# Patient Record
Sex: Male | Born: 1980 | Race: White | Hispanic: No | Marital: Single
Health system: Southern US, Community
[De-identification: ages and names within clinical notes are randomized; demographics above are authoritative.]

## PROBLEM LIST (undated history)

## (undated) DIAGNOSIS — Z8371 Family history of colonic polyps: Secondary | ICD-10-CM

## (undated) DIAGNOSIS — Z800 Family history of malignant neoplasm of digestive organs: Secondary | ICD-10-CM

## (undated) DIAGNOSIS — F909 Attention-deficit hyperactivity disorder, unspecified type: Secondary | ICD-10-CM

## (undated) DIAGNOSIS — F319 Bipolar disorder, unspecified: Secondary | ICD-10-CM

## (undated) HISTORY — DX: Family history of malignant neoplasm of digestive organs: Z80.0

## (undated) HISTORY — DX: Bipolar disorder, unspecified: F31.9

## (undated) HISTORY — DX: Attention-deficit hyperactivity disorder, unspecified type: F90.9

## (undated) HISTORY — DX: Family history of colonic polyps: Z83.71

---

## 2018-10-26 ENCOUNTER — Telehealth: Payer: Self-pay

## 2018-10-26 ENCOUNTER — Ambulatory Visit: Payer: Self-pay | Admitting: Psychiatry

## 2018-10-26 ENCOUNTER — Other Ambulatory Visit: Payer: Self-pay

## 2018-10-26 ENCOUNTER — Encounter: Payer: Self-pay | Admitting: Psychiatry

## 2018-10-26 VITALS — BP 119/78 | HR 58 | Temp 97.8°F | Wt 186.2 lb

## 2018-10-26 DIAGNOSIS — F17200 Nicotine dependence, unspecified, uncomplicated: Secondary | ICD-10-CM

## 2018-10-26 DIAGNOSIS — Z8659 Personal history of other mental and behavioral disorders: Secondary | ICD-10-CM

## 2018-10-26 DIAGNOSIS — F3160 Bipolar disorder, current episode mixed, unspecified: Secondary | ICD-10-CM

## 2018-10-26 DIAGNOSIS — F1210 Cannabis abuse, uncomplicated: Secondary | ICD-10-CM

## 2018-10-26 MED ORDER — RISPERIDONE 0.5 MG PO TABS: 0.5000 mg | ORAL_TABLET | Freq: Every day | ORAL | 1 refills | Status: DC

## 2018-10-26 MED ORDER — HYDROXYZINE PAMOATE 25 MG PO CAPS: 25.0000 mg | ORAL_CAPSULE | Freq: Every day | ORAL | 1 refills | Status: DC | PRN

## 2018-10-26 MED ORDER — DIVALPROEX SODIUM 250 MG PO DR TAB: 250.0000 mg | DELAYED_RELEASE_TABLET | Freq: Two times a day (BID) | ORAL | 1 refills | Status: DC

## 2018-10-26 MED ORDER — RISPERIDONE 0.5 MG PO TABS: 0.5000 mg | ORAL_TABLET | Freq: Every day | ORAL | 1 refills | Status: AC

## 2018-10-26 MED ORDER — DIVALPROEX SODIUM 250 MG PO DR TAB: 250.0000 mg | DELAYED_RELEASE_TABLET | Freq: Two times a day (BID) | ORAL | 1 refills | Status: AC

## 2018-10-26 MED ORDER — HYDROXYZINE PAMOATE 25 MG PO CAPS: 25.0000 mg | ORAL_CAPSULE | Freq: Every day | ORAL | 1 refills | Status: AC | PRN
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
## 2018-10-26 NOTE — Telephone Encounter (Signed)
pt called and left message on lea phone that he needs his rxs sent to [facility] on [address]

## 2018-10-26 NOTE — Telephone Encounter (Signed)
I gave him print out scripts ( hard scripts) ,please ask him to take it there if that is what he wants instead of medication management clinic.  thanks

## 2018-11-16 ENCOUNTER — Ambulatory Visit: Payer: Self-pay | Admitting: Psychiatry

## 2018-11-30 ENCOUNTER — Ambulatory Visit: Payer: Self-pay | Admitting: Psychiatry

## 2019-12-29 ENCOUNTER — Emergency Department (HOSPITAL_COMMUNITY): Payer: 59

## 2019-12-29 ENCOUNTER — Emergency Department (HOSPITAL_COMMUNITY)
Admission: EM | Admit: 2019-12-29 | Discharge: 2019-12-29 | Disposition: A | Discharge status: Home / Self Care | Payer: 59 | Source: Home / Self Care | Attending: Emergency Medicine | Admitting: Emergency Medicine

## 2019-12-29 ENCOUNTER — Other Ambulatory Visit: Payer: Self-pay

## 2019-12-29 ENCOUNTER — Emergency Department (HOSPITAL_COMMUNITY)
Admission: EM | Admit: 2019-12-29 | Discharge: 2019-12-29 | Disposition: A | Discharge status: Home / Self Care | Payer: 59 | Attending: Emergency Medicine | Admitting: Emergency Medicine

## 2019-12-29 ENCOUNTER — Encounter (HOSPITAL_COMMUNITY): Payer: Self-pay | Admitting: Emergency Medicine

## 2019-12-29 DIAGNOSIS — Y999 Unspecified external cause status: Secondary | ICD-10-CM | POA: Diagnosis not present

## 2019-12-29 DIAGNOSIS — Y92410 Unspecified street and highway as the place of occurrence of the external cause: Secondary | ICD-10-CM | POA: Diagnosis not present

## 2019-12-29 DIAGNOSIS — R451 Restlessness and agitation: Secondary | ICD-10-CM | POA: Diagnosis not present

## 2019-12-29 DIAGNOSIS — Y9389 Activity, other specified: Secondary | ICD-10-CM | POA: Insufficient documentation

## 2019-12-29 DIAGNOSIS — S0093XD Contusion of unspecified part of head, subsequent encounter: Secondary | ICD-10-CM | POA: Insufficient documentation

## 2019-12-29 DIAGNOSIS — R109 Unspecified abdominal pain: Secondary | ICD-10-CM | POA: Insufficient documentation

## 2019-12-29 DIAGNOSIS — R911 Solitary pulmonary nodule: Secondary | ICD-10-CM | POA: Insufficient documentation

## 2019-12-29 DIAGNOSIS — T1490XA Injury, unspecified, initial encounter: Secondary | ICD-10-CM

## 2019-12-29 DIAGNOSIS — Z5320 Procedure and treatment not carried out because of patient's decision for unspecified reasons: Secondary | ICD-10-CM | POA: Insufficient documentation

## 2019-12-29 DIAGNOSIS — S0093XA Contusion of unspecified part of head, initial encounter: Secondary | ICD-10-CM

## 2019-12-29 DIAGNOSIS — M542 Cervicalgia: Secondary | ICD-10-CM | POA: Insufficient documentation

## 2019-12-29 DIAGNOSIS — S0001XD Abrasion of scalp, subsequent encounter: Secondary | ICD-10-CM | POA: Insufficient documentation

## 2019-12-29 DIAGNOSIS — R410 Disorientation, unspecified: Secondary | ICD-10-CM | POA: Insufficient documentation

## 2019-12-29 DIAGNOSIS — R079 Chest pain, unspecified: Secondary | ICD-10-CM | POA: Insufficient documentation

## 2019-12-29 LAB — I-STAT CHEM 8, ED
BUN: 15 mg/dL (ref 6–20)
BUN: 10 mg/dL (ref 6–20)
Calcium, Ion: 1.09 mmol/L — ABNORMAL LOW (ref 1.15–1.40)
Calcium, Ion: 1.1 mmol/L — ABNORMAL LOW (ref 1.15–1.40)
Chloride: 107 mmol/L (ref 98–111)
Chloride: 109 mmol/L (ref 98–111)
Creatinine, Ser: 0.9 mg/dL (ref 0.61–1.24)
Creatinine, Ser: 0.8 mg/dL (ref 0.61–1.24)
Glucose, Bld: 85 mg/dL (ref 70–99)
Glucose, Bld: 89 mg/dL (ref 70–99)
HCT: 41 % (ref 39.0–52.0)
HCT: 46 % (ref 39.0–52.0)
Hemoglobin: 13.9 g/dL (ref 13.0–17.0)
Hemoglobin: 15.6 g/dL (ref 13.0–17.0)
Potassium: 3.7 mmol/L (ref 3.5–5.1)
Potassium: 4.5 mmol/L (ref 3.5–5.1)
Sodium: 141 mmol/L (ref 135–145)
Sodium: 141 mmol/L (ref 135–145)
TCO2: 25 mmol/L (ref 22–32)
TCO2: 24 mmol/L (ref 22–32)

## 2019-12-29 LAB — COMPREHENSIVE METABOLIC PANEL
ALT: 16 U/L (ref 0–44)
AST: 22 U/L (ref 15–41)
Albumin: 3.7 g/dL (ref 3.5–5.0)
Alkaline Phosphatase: 78 U/L (ref 38–126)
Anion gap: 8 (ref 5–15)
BUN: 13 mg/dL (ref 6–20)
CO2: 20 mmol/L — ABNORMAL LOW (ref 22–32)
Calcium: 8.1 mg/dL — ABNORMAL LOW (ref 8.9–10.3)
Chloride: 110 mmol/L (ref 98–111)
Creatinine, Ser: 0.82 mg/dL (ref 0.61–1.24)
GFR calc Af Amer: >60 mL/min (ref >60)
GFR calc non Af Amer: >60 mL/min (ref >60)
Glucose, Bld: 92 mg/dL (ref 70–99)
Potassium: 3.6 mmol/L (ref 3.5–5.1)
Sodium: 138 mmol/L (ref 135–145)
Total Bilirubin: 0.6 mg/dL (ref 0.3–1.2)
Total Protein: 5.9 g/dL — ABNORMAL LOW (ref 6.5–8.1)

## 2019-12-29 LAB — TYPE AND SCREEN
ABO/RH(D): A NEG
Antibody Screen: NEGATIVE

## 2019-12-29 LAB — CBC
HCT: 42.6 % (ref 39.0–52.0)
Hemoglobin: 14.4 g/dL (ref 13.0–17.0)
MCH: 30.7 pg (ref 26.0–34.0)
MCHC: 33.8 g/dL (ref 30.0–36.0)
MCV: 90.8 fL (ref 80.0–100.0)
Platelets: 179 10*3/uL (ref 150–400)
RBC: 4.69 MIL/uL (ref 4.22–5.81)
RDW: 13.1 % (ref 11.5–15.5)
WBC: 7.1 10*3/uL (ref 4.0–10.5)
nRBC: 0 % (ref 0.0–0.2)

## 2019-12-29 LAB — TROPONIN I (HIGH SENSITIVITY): Troponin I (High Sensitivity): <2 ng/L (ref <18)

## 2019-12-29 LAB — PROTIME-INR
INR: 1 (ref 0.8–1.2)
Prothrombin Time: 12.9 seconds (ref 11.4–15.2)

## 2019-12-29 LAB — CDS SEROLOGY

## 2019-12-29 LAB — ABO/RH: ABO/RH(D): A NEG

## 2019-12-29 LAB — ETHANOL: Alcohol, Ethyl (B): 173 mg/dL — ABNORMAL HIGH (ref <10)

## 2019-12-29 LAB — LACTIC ACID, PLASMA: Lactic Acid, Venous: 1.7 mmol/L (ref 0.5–1.9)

## 2019-12-29 MED ORDER — MIDAZOLAM HCL 2 MG/2ML IJ SOLN
4.0000 mg | Freq: Once | INTRAMUSCULAR | Status: DC
  Filled 2019-12-29: qty 4

## 2019-12-29 MED ORDER — CYCLOBENZAPRINE HCL 10 MG PO TABS: 10.0000 mg | ORAL_TABLET | Freq: Two times a day (BID) | ORAL | 0 refills | Status: AC | PRN

## 2019-12-29 MED ORDER — FENTANYL CITRATE (PF) 100 MCG/2ML IJ SOLN
50.0000 ug | Freq: Once | INTRAMUSCULAR | Status: AC
  Administered 2019-12-29: 03:00:00 50 ug via INTRAVENOUS

## 2019-12-29 MED ORDER — NAPROXEN 500 MG PO TABS: 500.0000 mg | ORAL_TABLET | Freq: Two times a day (BID) | ORAL | 0 refills | Status: AC

## 2019-12-29 MED ORDER — IOHEXOL 300 MG/ML  SOLN
100.0000 mL | Freq: Once | INTRAMUSCULAR | Status: AC | PRN
  Administered 2019-12-29: 100 mL via INTRAVENOUS

## 2019-12-29 NOTE — ED Triage Notes (Signed)
Pt left AMA then checked back in shortly after w/ complaints of dizziness, blurred vision and "just not feeling right."  Slightly agitated but able to be redirected. Provider instructed RN to re-order CTs that were ordered prior.
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
## 2019-12-29 NOTE — Progress Notes (Signed)
Orthopedic Tech Progress Note Patient Details:  Gregory Watkins 12/28/1874 [id number] Trauma Level 2 Patient ID: Gregory Watkins, male   DOB: 12/28/1874, 39 y.o.   MRN: [id number]   Ancil Linsey 12/29/2019, 2:29 AM

## 2019-12-29 NOTE — ED Notes (Signed)
Pt arguing on the phone with someone trying to get them to pick him up so he can leave. Pt visibly upset as he walked out the front doors.
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
## 2019-12-29 NOTE — Discharge Instructions (Signed)
Take medications as needed for pain.  Expect to be stiff and sore for the next few days.  Apply ice to try and help with any swelling and bruising.  The CT scans did not show any signs of serious injuries.  There were a couple of incidental radiology findings.  I have copied these findings below.  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 Consider follow-up imaging in 6-12 months with repeat CT of the chest to ensure stability. 3. Right upper lobe lung nodule measures 3 mm. No follow-up needed if patient is low-risk. Non-contrast chest CT can be considered in 12 months if patient is high-risk. This recommendation follows the consensus statement: Guidelines for Management of Incidental Pulmonary Nodules Detected on CT Images: From the Fleischner Society 2017; Radiology 2017; [phone]
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
## 2019-12-29 NOTE — ED Notes (Signed)
Cab voucher obtained by social work and given to patient after multiple attempts at contacting family and friends.

## 2019-12-29 NOTE — ED Notes (Signed)
Pt transported to CT ?
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
## 2019-12-29 NOTE — Care Management (Signed)
ED CM provided a taxi voucher. Patient does not have transportation to home.

## 2020-01-01 ENCOUNTER — Encounter: Payer: Self-pay | Admitting: Psychiatry

## 2020-01-23 ENCOUNTER — Ambulatory Visit: Payer: Self-pay | Admitting: Adult Health

## 2020-05-10 ENCOUNTER — Telehealth: Payer: Self-pay | Admitting: Genetic Counselor

## 2020-05-10 NOTE — Telephone Encounter (Signed)
Received a genetic counseling referral from the [place] [facility] for fhx of colon polyps. Mr. Gregory Watkins returned my call and has been scheduled to see Irving Burton on 5/25 at 1pm. Pt aware to  Arrive 15 minutes early.

## 2020-05-21 ENCOUNTER — Inpatient Hospital Stay: Payer: 59

## 2020-05-21 ENCOUNTER — Inpatient Hospital Stay: Payer: 59 | Attending: Genetic Counselor | Admitting: Genetic Counselor

## 2020-05-21 ENCOUNTER — Other Ambulatory Visit: Payer: Self-pay

## 2020-05-21 DIAGNOSIS — Z8371 Family history of colonic polyps: Secondary | ICD-10-CM

## 2020-05-21 DIAGNOSIS — Z800 Family history of malignant neoplasm of digestive organs: Secondary | ICD-10-CM | POA: Diagnosis not present

## 2020-05-22 ENCOUNTER — Encounter: Payer: Self-pay | Admitting: Genetic Counselor

## 2020-05-22 DIAGNOSIS — Z8371 Family history of colonic polyps: Secondary | ICD-10-CM | POA: Insufficient documentation

## 2020-05-22 DIAGNOSIS — Z800 Family history of malignant neoplasm of digestive organs: Secondary | ICD-10-CM | POA: Insufficient documentation

## 2020-05-22 NOTE — Progress Notes (Signed)
REFERRING PROVIDER: Geanie Kenning, PA-C [place],  [place] [address]  PRIMARY PROVIDER:  Rusty Aus, MD  PRIMARY REASON FOR VISIT:  1. Family history of FAP (familial adenomatous polyposis)   2. Family history of colon cancer      HISTORY OF PRESENT ILLNESS:   Gregory Watkins, a 39 y.o. male, was seen for a [place] cancer genetics consultation at the request of Gregory Bruckner, PA-C due to a family history of Familial Adenomatous Polyposis (FAP).  Gregory Watkins presents to clinic today to discuss the possibility of a hereditary predisposition to colon polyps/cancer, genetic testing, and to further clarify his future cancer risks, as well as potential cancer risks for family members.    Gregory Watkins does not have a personal history of cancer or colon polyps. He had a colonoscopy when he was 34 which was normal (records not available for review), and has not had a colonoscopy since then. He has a colonoscopy scheduled for 08/02/20.   Past Medical History:  Diagnosis Date  . ADHD (attention deficit hyperactivity disorder)   . Bipolar disorder ([place])   . Family history of colon cancer   . Family history of FAP (familial adenomatous polyposis)     No past surgical history on file.  Social History   Socioeconomic History  . Marital status: Single    Spouse name: Not on file  . Number of children: 1  . Years of education: Not on file  . Highest education level: High school graduate  Occupational History    Comment: full time  Tobacco Use  . Smoking status: Never Smoker  . Smokeless tobacco: Never Used  Substance and Sexual Activity  . Alcohol use: Yes  . Drug use: Never    Types: Marijuana    Comment: last couple months  . Sexual activity: Yes  Other Topics Concern  . Not on file  Social History Narrative   ** Merged History Encounter **       Social Determinants of Health   Financial Resource Strain:   . Difficulty of Paying Living Expenses:   Food  Insecurity:   . Worried About Charity fundraiser in the Last Year:   . Arboriculturist in the Last Year:   Transportation Needs:   . Film/video editor (Medical):   Marland Kitchen Lack of Transportation (Non-Medical):   Physical Activity:   . Days of Exercise per Week:   . Minutes of Exercise per Session:   Stress:   . Feeling of Stress :   Social Connections:   . Frequency of Communication with Friends and Family:   . Frequency of Social Gatherings with Friends and Family:   . Attends Religious Services:   . Active Member of Clubs or Organizations:   . Attends Archivist Meetings:   Marland Kitchen Marital Status:      FAMILY HISTORY:  We obtained a detailed, 4-generation family history.  Significant diagnoses are listed below: Family History  Problem Relation Age of Onset  . Alcohol abuse Mother   . Drug abuse Mother   . Multiple sclerosis Mother   . Familial polyposis Mother        FAP, colectomy  . Alcohol abuse Father   . Drug abuse Father   . Pancreatic cancer Father 60  . Familial polyposis Half-Sister 65       FAP, colectomy  . Alcohol abuse Maternal Grandfather   . Familial polyposis Maternal Grandfather  FAP, colectomy  . Colon cancer Maternal Grandfather   . Alcohol abuse Maternal Grandmother   . Alcohol abuse Paternal Grandfather   . Alcohol abuse Paternal Grandmother   . Alcohol abuse Paternal Uncle   . Familial polyposis Other        FAP, maternal grandfather's siblings (x6)  . Heart disease Maternal Uncle   . Familial polyposis Maternal Great-grandmother        FAP   Gregory Watkins has one daughter who is 36 and has not had cancer or a colonoscopy. He has a maternal half-sister who is 35 and was diagnosed with FAP at the age of 11, after her appendix burst. She had a colectomy shortly after diagnosis. Gregory Watkins sister has five children (youngest is 38 years old) who have not been evaluated for FAP. He also has a paternal half-brother who is 28 and has not had  cancer.   [name] mother is 48 and has been diagnosed with FAP. She has had a colectomy. He has two maternal uncles - one died in his mid-50s from heart disease, and one is 67 and healthy. His maternal grandmother died at the age of 38. His maternal grandfather died at the age of 6 and had FAP and colon cancer. His grandfather had six siblings who also had FAP, and most of their children have FAP as well. His maternal great-grandmother died at the age of 59 from [place] (unclear if she was diagnosed with colon cancer). None of his family members with FAP have had genetic testing, to [name]'s knowledge.  Gregory Watkins father died at the age of 30 from pancreatic cancer diagnosed when he was 78. He had a history of smoking. Gregory Watkins has one paternal aunt who is in her 58s, and he has limited information about her health. He has one paternal uncle who died in his 49s from alcoholism. His paternal grandmother is currently in her 66s and has not had cancer, and his paternal grandfather died at the age of 38 from diabetes.  Gregory Watkins is unaware of previous family history of genetic testing for hereditary cancer risks. His maternal ancestors are of [place] and [place] descent, and paternal ancestors are of [place] and [place] descent. There is no reported Ashkenazi Jewish ancestry. There is no known consanguinity.  GENETIC COUNSELING ASSESSMENT: Gregory Watkins is a 39 y.o. male with a family history of Familial Adenomatous Polyposis in multiple relatives. We, therefore, discussed and recommended the following at today's visit.   DISCUSSION:  We discussed that Familial Adenomatous Polyposis (FAP) is an autosomal dominant genetic condition characterized by the development of hundreds to thousands of colorectal polyps. The risk for colon cancer for individuals with FAP may be as high as 100% if left untreated. There are other cancer risks associated with FAP, as well as other benign features that may be present. These  features may include harmless freckle-like spots on the inside of the eye called Congenital Hypertrophy of Retinal Pigment Epithelium (CHRPE), desmoid tumors (tumors of connective tissue), osteomas (bony growths on the skull and jaw), epidermoid cysts (cysts on the skin), and extra teeth or dental abnormalities. Surgical and screening options are recommended for individuals with FAP to manage the associated cancer risks. Because it is an autosomal dominant condition, Gregory Watkins has a 50% (1 in 2) chance to also have FAP.  We reviewed the characteristics, features and inheritance patterns of FAP and hereditary cancer syndromes. We also discussed genetic testing, including the appropriate family members  to test, the process of testing, insurance coverage, genetic discrimination, and turn-around-time for results. We discussed the implications of a negative, positive and/or variant of uncertain significant result. We recommended Gregory Watkins pursue genetic testing for the [facility] CancerNext-Expanded + RNAinsight gene panel.   The CancerNext-Expanded + RNAinsight gene panel offered by [employer] and includes sequencing and rearrangement analysis for the following 77 genes: AIP, ALK, APC*, ATM*, AXIN2, BAP1, BARD1, BLM, BMPR1A, BRCA1*, BRCA2*, BRIP1*, CDC73, CDH1*, CDK4, CDKN1B, CDKN2A, CHEK2*, CTNNA1, DICER1, FANCC, FH, FLCN, GALNT12, KIF1B, LZTR1, MAX, MEN1, MET, MLH1*, MSH2*, MSH3, MSH6*, MUTYH*, NBN, NF1*, NF2, NTHL1, PALB2*, PHOX2B, PMS2*, POT1, PRKAR1A, PTCH1, PTEN*, RAD51C*, RAD51D*, RB1, RECQL, RET, SDHA, SDHAF2, SDHB, SDHC, SDHD, SMAD4, SMARCA4, SMARCB1, SMARCE1, STK11, SUFU, TMEM127, TP53*, TSC1, TSC2, VHL and XRCC2 (sequencing and deletion/duplication); EGFR, EGLN1, HOXB13, KIT, MITF, PDGFRA, POLD1 and POLE (sequencing only); EPCAM and GREM1 (deletion/duplication only). DNA and RNA analyses performed for * genes.   Based on Gregory Watkins's family history of FAP, he meets medical criteria for genetic testing.  Despite that he meets criteria, he may still have an out of pocket cost. We discussed that if his out of pocket cost for testing is over $100, the laboratory will call and confirm whether he wants to proceed with testing.  If the out of pocket cost of testing is less than $100 he will be billed by the genetic testing laboratory.   We discussed that some people do not want to undergo genetic testing due to fear of genetic discrimination.  A federal law called the Genetic Information Non-Discrimination Act (GINA) of 2008 helps protect individuals against genetic discrimination based on their genetic test results.  It impacts both health insurance and employment.  With health insurance, it protects against increased premiums, being kicked off insurance or being forced to take a test in order to be insured.  For employment it protects against hiring, firing and promoting decisions based on genetic test results.  Health status due to a cancer diagnosis is not protected under GINA.  Additionally, life, disability, and long-term care insurance is not protected under GINA.   PLAN: After considering the risks, benefits, and limitations, Gregory Watkins provided informed consent to pursue genetic testing and the blood sample was sent to [facility] for analysis of the CancerNext-Expanded + RNAinsight panel. Results should be available within approximately two-three weeks' time, at which point they will be disclosed by telephone to Gregory Watkins, as will any additional recommendations warranted by these results. Gregory Watkins will receive a summary of his genetic counseling visit and a copy of his results once available. This information will also be available in Epic.   Gregory Watkins questions were answered to his satisfaction today. Our contact information was provided should additional questions or concerns arise. Thank you for the referral and allowing [place] to share in the care of your patient.   Gregory Watkins, [place],  [facility] Licensed, Certified Dispensing [email or web address] Phone: [phone]  The patient was seen for a total of 40 minutes in face-to-face genetic counseling.  This patient was discussed with Drs. Magrinat, Lindi Adie and/or Burr Medico who agrees with the above.    _______________________________________________________________________ For Office Staff:  Number of people involved in session: 1 Was an Intern/ student involved with case: no

## 2020-06-05 ENCOUNTER — Telehealth: Payer: Self-pay | Admitting: Genetic Counselor

## 2020-06-05 NOTE — Telephone Encounter (Signed)
Called to discuss genetic test results - unable to LVM because the mailbox was full.

## 2020-06-07 NOTE — Telephone Encounter (Signed)
Called to discuss genetic test results - again unable to LVM because the mailbox was full.

## 2020-06-17 ENCOUNTER — Telehealth: Payer: Self-pay | Admitting: Genetic Counselor

## 2020-06-17 ENCOUNTER — Encounter: Payer: Self-pay | Admitting: Genetic Counselor

## 2020-06-17 ENCOUNTER — Ambulatory Visit: Payer: Self-pay | Admitting: Genetic Counselor

## 2020-06-17 DIAGNOSIS — Z1379 Encounter for other screening for genetic and chromosomal anomalies: Secondary | ICD-10-CM

## 2020-06-17 NOTE — Telephone Encounter (Signed)
Revealed negative genetic testing. Discussed that we do not know why there is Familial Adenomatous Polyposis (FAP) in the family. There could be a genetic mutation in the family that Gregory Watkins did not inherit. There could also be a mutation in a different gene that we are not testing, or our current technology may not be able detect certain mutations. It will therefore be important for him to keep his colonoscopy as scheduled and follow the screening recommendations of his GI provider, and to stay in contact with genetics to keep up with whether additional testing may be appropriate in the future.

## 2020-06-17 NOTE — Progress Notes (Signed)
HPI:  Gregory Watkins was previously seen in the [facility] due to a family history of Familial Adenomatous Polyposis (FAP) and cancer, and concerns regarding a hereditary predisposition to cancer. Please refer to our prior cancer genetics clinic note for more information regarding our discussion, assessment and recommendations, at the time. Gregory Watkins recent genetic test results were disclosed to him, as were recommendations warranted by these results. These results and recommendations are discussed in more detail below.   FAMILY HISTORY:  We obtained a detailed, 4-generation family history.  Significant diagnoses are listed below: Family History  Problem Relation Age of Onset  . Alcohol abuse Mother   . Drug abuse Mother   . Multiple sclerosis Mother   . Familial polyposis Mother        FAP, colectomy  . Alcohol abuse Father   . Drug abuse Father   . Pancreatic cancer Father 30  . Familial polyposis Half-Sister 44       FAP, colectomy  . Alcohol abuse Maternal Grandfather   . Familial polyposis Maternal Grandfather        FAP, colectomy  . Colon cancer Maternal Grandfather   . Alcohol abuse Maternal Grandmother   . Alcohol abuse Paternal Grandfather   . Alcohol abuse Paternal Grandmother   . Alcohol abuse Paternal Uncle   . Familial polyposis Other        FAP, maternal grandfather's siblings (x6)  . Heart disease Maternal Uncle   . Familial polyposis Maternal Great-grandmother        FAP   Gregory Watkins has one daughter who is 80 and has not had cancer or a colonoscopy. He has a maternal half-sister who is 9 and was diagnosed with FAP at the age of 42, after her appendix burst. She had a colectomy shortly after diagnosis. Gregory Watkins sister has five children (youngest is 73 years old) who have not been evaluated for FAP. He also has a paternal half-brother who is 11 and has not had cancer.   Gregory Watkins mother is 60 and has been diagnosed with FAP. She has had a  colectomy. He has two maternal uncles - one died in his mid-50s from heart disease, and one is 23 and healthy. His maternal grandmother died at the age of 70. His maternal grandfather died at the age of 78 and had FAP and colon cancer. His grandfather had six siblings who also had FAP, and most of their children have FAP as well. His maternal great-grandmother died at the age of 60 from [place] (unclear if she was diagnosed with colon cancer). None of his family members with FAP have had genetic testing, to Gregory Watkins knowledge.  Gregory Watkins father died at the age of 98 from pancreatic cancer diagnosed when he was 20. He had a history of smoking. Gregory Watkins has one paternal aunt who is in her 45s, and he has limited information about her health. He has one paternal uncle who died in his 43s from alcoholism. His paternal grandmother is currently in her 67s and has not had cancer, and his paternal grandfather died at the age of 16 from diabetes.  Gregory Watkins is unaware of previous family history of genetic testing for hereditary cancer risks. His maternal ancestors are of [place] and [place] descent, and paternal ancestors are of [place] and [place] descent. There is no reported Ashkenazi Jewish ancestry. There is no known consanguinity.  GENETIC TEST RESULTS: Genetic testing reported out on 05/31/2020 through the [facility]  CancerNext-Expanded + RNAinsight panel. No pathogenic variants were detected.   The CancerNext-Expanded + RNAinsight gene panel offered by [employer] and includes sequencing and rearrangement analysis for the following 77 genes: AIP, ALK, APC*, ATM*, AXIN2, BAP1, BARD1, BLM, BMPR1A, BRCA1*, BRCA2*, BRIP1*, CDC73, CDH1*, CDK4, CDKN1B, CDKN2A, CHEK2*, CTNNA1, DICER1, FANCC, FH, FLCN, GALNT12, KIF1B, LZTR1, MAX, MEN1, MET, MLH1*, MSH2*, MSH3, MSH6*, MUTYH*, NBN, NF1*, NF2, NTHL1, PALB2*, PHOX2B, PMS2*, POT1, PRKAR1A, PTCH1, PTEN*, RAD51C*, RAD51D*, RB1, RECQL, RET, SDHA, SDHAF2, SDHB, SDHC, SDHD,  SMAD4, SMARCA4, SMARCB1, SMARCE1, STK11, SUFU, TMEM127, TP53*, TSC1, TSC2, VHL and XRCC2 (sequencing and deletion/duplication); EGFR, EGLN1, HOXB13, KIT, MITF, PDGFRA, POLD1 and POLE (sequencing only); EPCAM and GREM1 (deletion/duplication only). DNA and RNA analyses performed for * genes. The test report will be scanned into EPIC and located under the Molecular Pathology section of the Results Review tab.  A portion of the result report is included below for reference.     We discussed with Gregory Watkins that because current genetic testing is not perfect, it is possible there may be a gene mutation in one of these genes that current testing cannot detect, but that chance is small.  We also discussed, that there could be another gene that has not yet been discovered, or that we have not yet tested, that is responsible for the cancer diagnoses in the family. It is also possible there is a hereditary cause for the cancer in the family that Gregory Watkins did not inherit and therefore was not identified in his testing. Therefore, it is important to remain in touch with cancer genetics in the future so that we can continue to offer Gregory Watkins the most up to date genetic testing.   CANCER SCREENING RECOMMENDATIONS: Gregory Watkins test result is considered negative (normal). This means that we have not identified a hereditary cause for his family history of cancer and/or FAP at this time. While reassuring, this does not definitively rule out a hereditary predisposition to polyps/cancer. It is still possible that there could be genetic mutations that are undetectable by current technology. There could be genetic mutations in genes that have not been tested or identified to increase cancer risk. Therefore, it is recommended he continue to follow the cancer management and screening guidelines provided by his GI and primary healthcare providers.   An individual's cancer risk and medical management are not determined by genetic  test results alone. Overall cancer risk assessment incorporates additional factors, including personal medical history, family history, and any available genetic information that may result in a personalized plan for cancer prevention and surveillance.  The NCCN Guidelines (Genetic/Familial High-Risk Assessment: Colorectal Version 1.2021) recommends the following for individuals who have a family history of FAP in a first degree relative with no identified genetic mutation:  High quality colonoscopy every 12 months beginning at age 38-15 y. In some families, based on clinical judgement, initiation colonoscopy beginning in late teens, then every 2 y may be appropriate.   If no adenomas, then can lengthen interval to every 2 y. If multiple surveillance exams without adenomas on follow-up, may lengthen interval further based on clinical judgement.   If >100 adenomas found, manage based on classical FAP treatment and surveillance or  If <100 adenomas found, manage based on AFAP treatment and surveillance  RECOMMENDATIONS FOR FAMILY MEMBERS:  Individuals in this family might be at some increased risk of developing cancer, over the general population risk, simply due to the family history of cancer.  We recommended women  in this family have a yearly mammogram beginning at age 39, or 9 years younger than the earliest onset of cancer, an annual clinical breast exam, and perform monthly breast self-exams. Women in this family should also have a gynecological exam as recommended by their primary provider.   We discussed with Mr. Kostelnik that his sister's children should all be evaluated for FAP.   It is also possible there is a hereditary cause for the cancer in Mr. Gills's family that he did not inherit and therefore was not identified in him.  Based on Mr. Power's family history, we recommended his sister and his mother, who were both diagnosed with FAP, have genetic counseling and testing. Mr. Garriga will let  us know if we can be of any assistance in coordinating genetic counseling and/or testing for these family members.   FOLLOW-UP: Lastly, we discussed with Mr. Prevo that cancer genetics is a rapidly advancing field and it is possible that new genetic tests will be appropriate for him and/or his family members in the future. We encouraged him to remain in contact with cancer genetics on an annual basis so we can update his personal and family histories and let him know of advances in cancer genetics that may benefit this family.   Our contact number was provided. Mr. Fukushima questions were answered to his satisfaction, and he knows he is welcome to call us at anytime with additional questions or concerns.   Gregory Guy, Gregory Watkins, Gregory Watkins Phone: [phone]
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
## 2021-06-27 DEATH — deceased
